# Patient Record
Sex: Male | Born: 1970 | Hispanic: Yes | Marital: Single | Smoking: Never smoker
Health system: Southern US, Community
[De-identification: ages and names within clinical notes are randomized; demographics above are authoritative.]

## PROBLEM LIST (undated history)

## (undated) DIAGNOSIS — E119 Type 2 diabetes mellitus without complications: Secondary | ICD-10-CM

---

## 2014-07-30 ENCOUNTER — Other Ambulatory Visit: Payer: Self-pay | Admitting: Infectious Disease

## 2014-07-30 ENCOUNTER — Ambulatory Visit
Admission: RE | Admit: 2014-07-30 | Discharge: 2014-07-30 | Disposition: A | Discharge status: Home / Self Care | Payer: No Typology Code available for payment source | Source: Ambulatory Visit | Attending: Infectious Disease | Admitting: Infectious Disease

## 2014-07-30 DIAGNOSIS — R7611 Nonspecific reaction to tuberculin skin test without active tuberculosis: Secondary | ICD-10-CM

## 2016-05-06 IMAGING — CR DG CHEST 1V
1 series · 1 of 1 positions shown · non-contrast
Comparison: None.

CLINICAL DATA: Positive PPD.

EXAM:
CHEST  1 VIEW

[w chest pa]
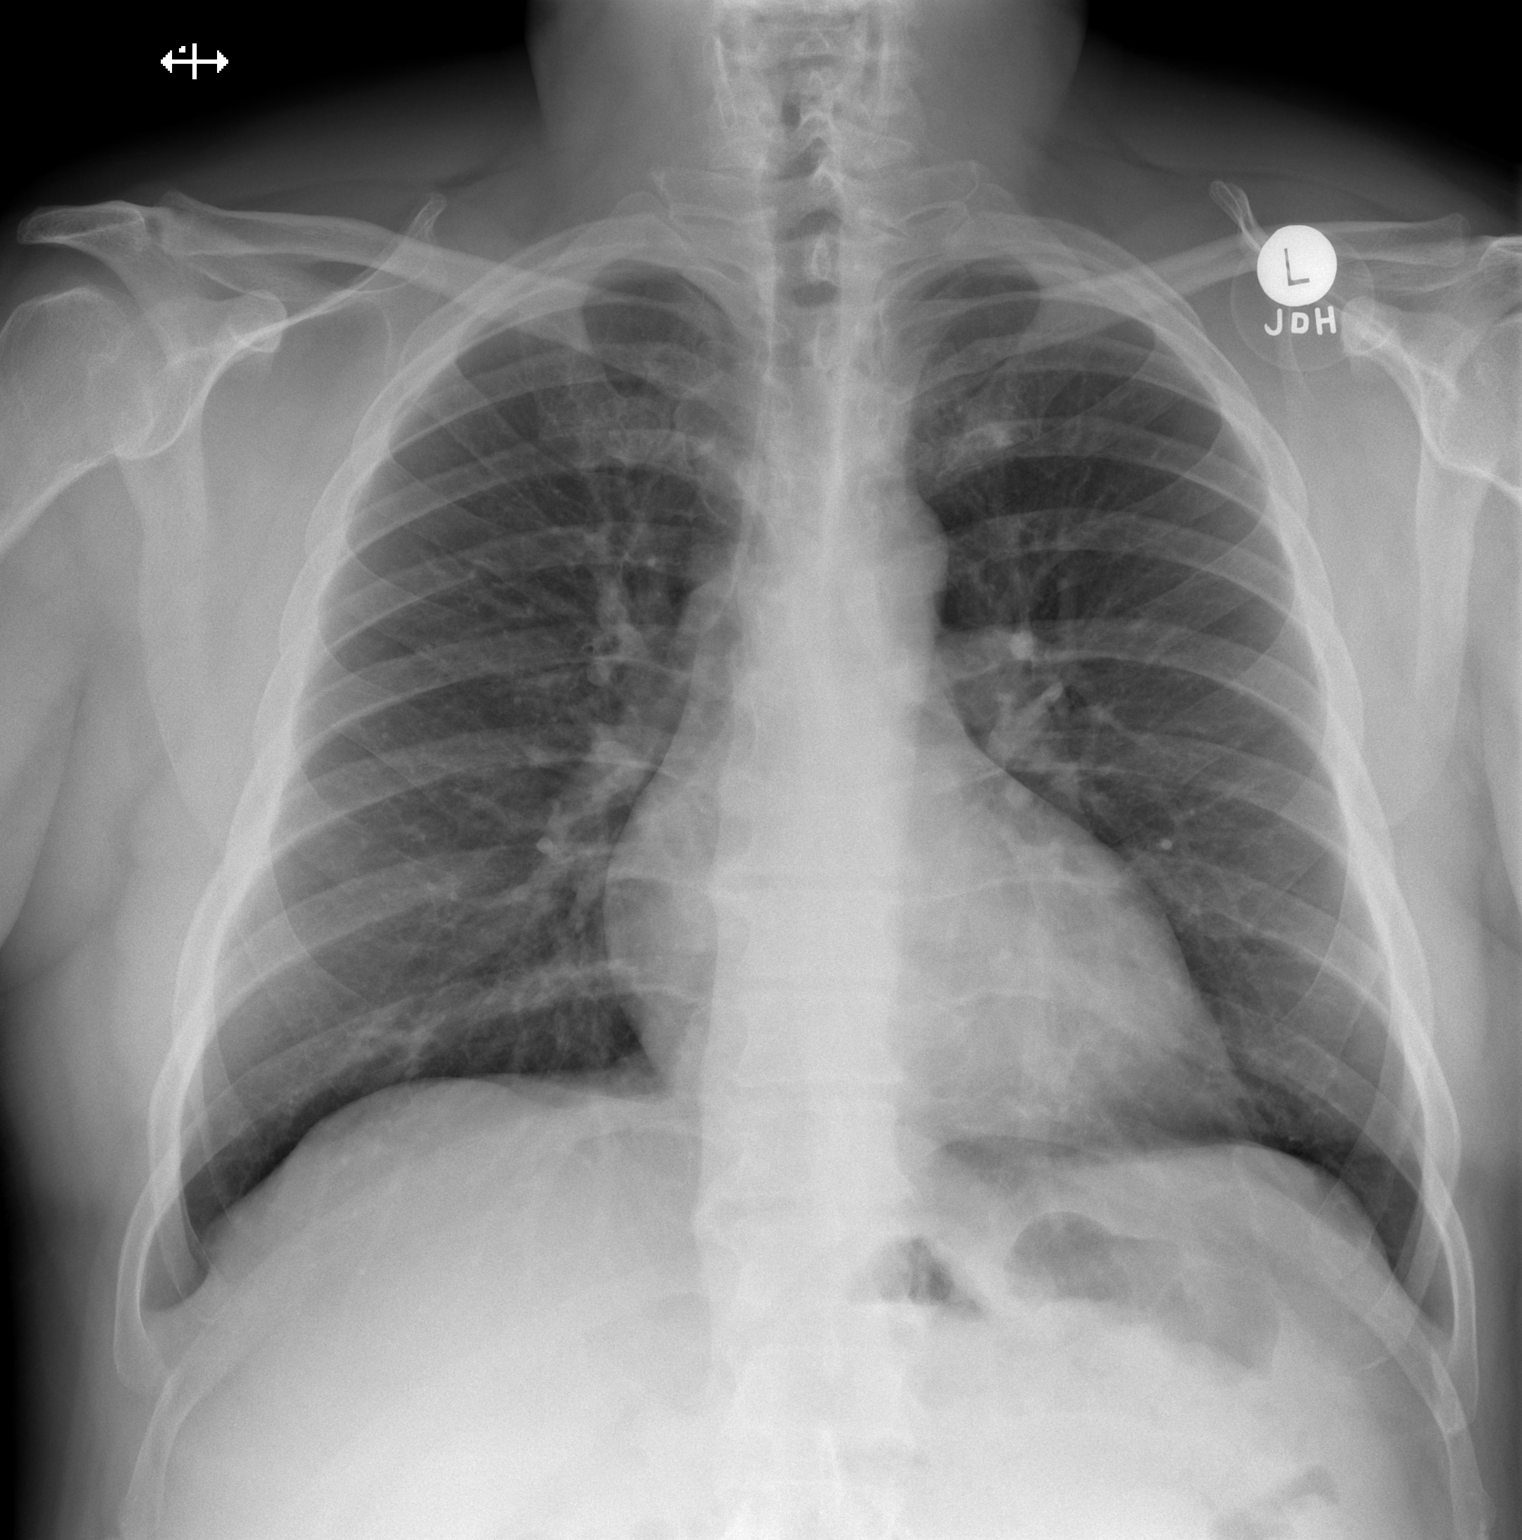

[1 of 1 positions shown; findings below may reference images not displayed]

FINDINGS: The heart size and mediastinal contours are within normal limits.
Both lungs are clear. The visualized skeletal structures are
unremarkable.
IMPRESSION: Normal exam.

## 2018-12-09 ENCOUNTER — Ambulatory Visit
Admission: EM | Admit: 2018-12-09 | Discharge: 2018-12-09 | Disposition: A | Discharge status: Home / Self Care | Payer: Self-pay | Attending: Emergency Medicine | Admitting: Emergency Medicine

## 2018-12-09 ENCOUNTER — Ambulatory Visit (INDEPENDENT_AMBULATORY_CARE_PROVIDER_SITE_OTHER): Payer: Self-pay

## 2018-12-09 ENCOUNTER — Other Ambulatory Visit: Payer: Self-pay

## 2018-12-09 DIAGNOSIS — M25561 Pain in right knee: Secondary | ICD-10-CM

## 2018-12-09 DIAGNOSIS — M25761 Osteophyte, right knee: Secondary | ICD-10-CM

## 2018-12-09 DIAGNOSIS — M2570 Osteophyte, unspecified joint: Secondary | ICD-10-CM

## 2018-12-09 HISTORY — DX: Type 2 diabetes mellitus without complications: E11.9

## 2018-12-09 MED ORDER — PREDNISONE 10 MG (21) PO TBPK: ORAL_TABLET | Freq: Every day | ORAL | 0 refills | Status: AC

## 2018-12-09 NOTE — ED Provider Notes (Addendum)
[facility]    CSN: [id number] Arrival date & time: 12/09/18  1038      History   Chief Complaint Chief Complaint  Patient presents with  . Leg Pain    right    HPI Bryan Evans is a 48 y.o. male with history of diabetes presents with his sister, who provides translation, for 2-week course of intermittent right knee pain.  Patient states he tried going to work this morning, though had such pain prompted him to seek evaluation.  Patient denies trauma to the area, works [employer] and has to do a lot of heavy lifting, bending, pulling.  Patient denies snap/pop/pulling sensation.  States pain will occasionally radiate up the anterior aspect of his thigh.  Denies distal extremity weakness, numbness.  Patient sister states that patient has had recurrent issues with this leg over the years.  Denies previous imaging.  Patient has taken ibuprofen without significant relief of pain.   Past Medical History:  Diagnosis Date  . Diabetes mellitus without complication (HCC)     There are no active problems to display for this patient.   History reviewed. No pertinent surgical history.     Home Medications    Prior to Admission medications   Medication Sig Start Date End Date Taking? Authorizing Provider  predniSONE (STERAPRED UNI-PAK 21 TAB) 10 MG (21) TBPK tablet Take by mouth daily. Take steroid taper as written 12/09/18   Hall-Potvin, [place], PA-C    Family History Family History  Problem Relation Age of Onset  . Hypertension Mother   . Diabetes Father   . Hypertension Father     Social History Social History   Tobacco Use  . Smoking status: Never Smoker  . Smokeless tobacco: Never Used  Substance Use Topics  . Alcohol use: Yes    Comment: occasionally  . Drug use: Not on file     Allergies   Penicillins   Review of Systems Review of Systems  Constitutional: Negative for fatigue and fever.  Respiratory: Negative for cough and shortness  of breath.   Cardiovascular: Negative for chest pain and palpitations.  Musculoskeletal:       Positive for right knee pain and swelling, difficulty ambulating  Neurological: Negative for weakness and numbness.     Physical Exam Triage Vital Signs ED Triage Vitals  Enc Vitals Group     BP 12/09/18 1047 (!) 162/92     Pulse Rate 12/09/18 1047 (!) 51     Resp 12/09/18 1047 18     Temp 12/09/18 1047 (!) 97.5 F (36.4 C)     Temp Source 12/09/18 1047 Oral     SpO2 12/09/18 1047 96 %     Weight --      Height --      Head Circumference --      Peak Flow --      Pain Score 12/09/18 1048 10     Pain Loc --      Pain Edu? --      Excl. in GC? --    No data found.  Updated Vital Signs BP (!) 162/92 (BP Location: Left Arm)   Pulse (!) 51   Temp (!) 97.5 F (36.4 C) (Oral)   Resp 18   SpO2 96%   Visual Acuity Right Eye Distance:   Left Eye Distance:   Bilateral Distance:    Right Eye Near:   Left Eye Near:    Bilateral Near:     Physical  Exam Constitutional:      General: He is not in acute distress. HENT:     Head: Normocephalic and atraumatic.  Eyes:     General: No scleral icterus.    Pupils: Pupils are equal, round, and reactive to light.  Cardiovascular:     Rate and Rhythm: Normal rate.  Pulmonary:     Effort: Pulmonary effort is normal. No respiratory distress.     Breath sounds: No wheezing.  Musculoskeletal:     Comments: Right knee with mild to moderate amount of diffuse joint swelling as compared to left.  Negative blotting test.  No erythema, warmth, ecchymosis.  Patient has full active ROM, though endorses pain with full flexion, extension.  Mild crepitus noted on medial aspect.  Patient more tender all on medial aspect of knee joint.  No patellar tenderness.  Joint stable to valgus and varus stress.  Negative anterior drawer test.  Unable to bear weight second pain.  Skin:    General: Skin is warm.     Coloration: Skin is not jaundiced.      Findings: No bruising.  Neurological:     General: No focal deficit present.     Mental Status: He is alert.     Sensory: No sensory deficit.     Deep Tendon Reflexes: Reflexes normal.      UC Treatments / Results  Labs (all labs ordered are listed, but only abnormal results are displayed) Labs Reviewed - No data to display  EKG   Radiology Dg Knee Complete 4 Views Right  Result Date: 12/09/2018 CLINICAL DATA:  Worsening right knee pain for 2 weeks. No known injury. EXAM: RIGHT KNEE - COMPLETE 4+ VIEW COMPARISON:  None. FINDINGS: There is no acute bony or joint abnormality. Medial compartment joint space narrowing is seen. Osteophytosis is present about the medial and patellofemoral compartments. Small to moderate joint effusion. No chondrocalcinosis. No focal bony lesion. IMPRESSION: No acute abnormality. 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 Small joint effusion. Electronically Signed   By: Inge Rise M.D.   On: 12/09/2018 11:53    Procedures Procedures (including critical care time)  Medications Ordered in UC Medications - No data to display  Initial Impression / Assessment and Plan / UC Course  I have reviewed the triage vital signs and the nursing notes.  Pertinent labs & imaging results that were available during my care of the patient were reviewed by me and considered in my medical decision making (see chart for details).     1.  Acute pain of right knee Inability to bear weight, radiography obtained in office, reviewed by me and radiology without previous to compare: Medial compartment joint space narrowing seen with osteo-cytosis about the medial and patellofemoral compartments with small to moderate joint effusion.  No focal bony lesion or chondrocalcinosis.  Able to discern effusion on exam: Joint aspiration deferred.  Patient given knee brace for additional support/compression.  Patient declined crutches.  Will add prednisone  taper pack as adjuvant therapy.  Provided sports medicine contact information for follow-up within the week.  Return precautions discussed, patient verbalized understanding and is agreeable to plan.  Patient initially bradycardic with heart rate of 51 bpm.  Patient denying chest pain, fatigue, lightheadedness, shortness of breath.  Repeat HR prior to discharge 58 bpm per provider.  Will follow with PCP for further surveillance. Final Clinical Impressions(s) / UC Diagnoses   Final diagnoses:  Osteophytosis  Acute pain of right knee  Discharge Instructions     Take prednisone taper pack as prescribed. Rest, ice, compress, elevate knee for additional pain and swelling relief. Follow-up with specialist. Return for worsening pain, redness, inability to walk, fever.    ED Prescriptions    Medication Sig Dispense Auth. Provider   predniSONE (STERAPRED UNI-PAK 21 TAB) 10 MG (21) TBPK tablet Take by mouth daily. Take steroid taper as written 21 tablet Hall-Potvin, [place], PA-C     Controlled Substance Prescriptions [place] Controlled Substance Registry consulted? Not Applicable   Shea EvansHall-Potvin, Brittany, PA-C 12/09/18 1754    Hall-Potvin, [place], [place]-C 12/09/18 1911

## 2018-12-09 NOTE — ED Triage Notes (Signed)
Pt presents to UC w/ c/o right leg pain starting at the knee and shoots up to his right hip x2 weeks. Pt denies having an injury to that knee. Pt reports taking OTC medication for pain today but does not remember the name of it. Pt reports the pain medication does not help the pain.

## 2018-12-09 NOTE — Discharge Instructions (Signed)
Take prednisone taper pack as prescribed. Rest, ice, compress, elevate knee for additional pain and swelling relief. Follow-up with specialist. Return for worsening pain, redness, inability to walk, fever.

## 2018-12-11 ENCOUNTER — Telehealth: Payer: Self-pay

## 2018-12-12 ENCOUNTER — Telehealth: Payer: Self-pay | Admitting: Emergency Medicine

## 2018-12-12 MED ORDER — MELOXICAM 7.5 MG PO TABS: 7.5000 mg | ORAL_TABLET | Freq: Every day | ORAL | 0 refills | Status: AC

## 2018-12-12 NOTE — Telephone Encounter (Signed)
Patient presented today in person to discuss that the steroid is not helping his pain and he needs something for his pain.  Amy, APP on site today, reviewed the patient's chart and [facility] prescription sent.  Patient made aware and verbalized understanding.
